# Patient Record
Sex: Male | Born: 1980 | Race: White | Hispanic: Yes | Marital: Married | Smoking: Never smoker
Health system: Southern US, Community
[De-identification: ages and names within clinical notes are randomized; demographics above are authoritative.]

---

## 2005-11-25 ENCOUNTER — Emergency Department (HOSPITAL_COMMUNITY): Admission: EM | Admit: 2005-11-25 | Discharge: 2005-11-25 | Payer: Self-pay | Admitting: Emergency Medicine

## 2006-11-29 ENCOUNTER — Ambulatory Visit: Payer: Self-pay | Admitting: Family Medicine

## 2006-11-30 ENCOUNTER — Ambulatory Visit: Payer: Self-pay | Admitting: *Deleted

## 2006-12-17 ENCOUNTER — Ambulatory Visit: Payer: Self-pay | Admitting: Family Medicine

## 2007-08-14 ENCOUNTER — Encounter (INDEPENDENT_AMBULATORY_CARE_PROVIDER_SITE_OTHER): Payer: Self-pay | Admitting: *Deleted

## 2015-02-06 ENCOUNTER — Encounter: Payer: Self-pay | Admitting: *Deleted

## 2015-02-06 ENCOUNTER — Ambulatory Visit (INDEPENDENT_AMBULATORY_CARE_PROVIDER_SITE_OTHER): Payer: Self-pay | Admitting: Family Medicine

## 2015-02-06 ENCOUNTER — Ambulatory Visit (INDEPENDENT_AMBULATORY_CARE_PROVIDER_SITE_OTHER): Payer: Self-pay

## 2015-02-06 VITALS — BP 130/74 | HR 76 | Temp 98.7°F | Resp 16 | Ht 66.5 in | Wt 168.1 lb

## 2015-02-06 DIAGNOSIS — M545 Low back pain: Secondary | ICD-10-CM

## 2015-02-06 MED ORDER — HYDROCODONE-ACETAMINOPHEN 5-325 MG PO TABS: 1.0000 | ORAL_TABLET | Freq: Four times a day (QID) | ORAL | Status: AC | PRN

## 2015-02-06 MED ORDER — PREDNISONE 20 MG PO TABS: 40.0000 mg | ORAL_TABLET | Freq: Every day | ORAL | Status: AC

## 2015-02-06 NOTE — Progress Notes (Addendum)
° °  Subjective:    Patient ID: Allen Dickson, male    DOB: 01/11/1981, 34 y.o.   MRN: [id number] This chart was scribed for Elvina SidleKurt Lauenstein, MD by Littie Deedsichard Sun, Medical Scribe. This patient was seen in Room 14 and the patient's care was started at 3:48 PM.   HPI HPI Comments: Allen Dickson is a 34 y.o. male who presents to the [facility] complaining of sudden onset left-sided low back pain that started 3 days ago while he was walking. He has taken aspirin for his pain. Patient denies abdominal pain and urinary symptoms.  Patient works in [employer].  Review of Systems  Gastrointestinal: Negative for abdominal pain.  Genitourinary: Negative.   Musculoskeletal: Positive for back pain.       Objective:   Physical Exam CONSTITUTIONAL: Well developed/well nourished HEAD: Normocephalic/atraumatic EYES: EOM/PERRL ENMT: Mucous membranes moist NECK: supple no meningeal signs SPINE: Mildly tender on left side of lumbar spine with no scoliosis. Straight leg raise positive bilaterally. He cannot lie down without pain.  No scoliosis CV: S1/S2 noted, no murmurs/rubs/gallops noted LUNGS: Lungs are clear to auscultation bilaterally, no apparent distress ABDOMEN: soft, nontender, no rebound or guarding GU: no cva tenderness NEURO: Pt is awake/alert, moves all extremitiesx4. Good strength in both legs. Weak reflexes in knees and ankles which are symmetrical.  EXTREMITIES: pulses normal, full ROM SKIN: warm, color normal PSYCH: no abnormalities of mood noted  [facility] reading (PRIMARY) by  Dr. Milus GlazierLauenstein:  LS spine films:  No scoliosis, no bony abnormality.  Possible very slight narrowing of disc space L5-S1      Assessment & Plan:   This chart was scribed in my presence and reviewed by me personally.    ICD-9-CM ICD-10-CM   1. Low back pain, unspecified back pain laterality, with sciatica presence unspecified 724.2 M54.5 DG Lumbar Spine 2-3 Views       predniSONE (DELTASONE) 20 MG tablet     HYDROcodone-acetaminophen (NORCO) 5-325 MG per tablet    Signed, Elvina SidleKurt Lauenstein, MD

## 2015-02-06 NOTE — Patient Instructions (Signed)
Citica  (Sciatica)  La citica es el dolor, debilidad, entumecimiento u hormigueo a lo largo del nervio citico. El nervio comienza en la zona inferior de la espalda y desciende por la parte posterior de cada pierna. El nervio controla los msculos de la parte inferior de la pierna y de la zona posterior de la rodilla, y transmite la sensibilidad a la parte posterior del muslo, la pierna y la planta del pie. La citica es un sntoma de otras afecciones mdicas. Por ejemplo, un dao a los nervios o algunas enfermedades como un disco herniado o un espoln seo en la columna vertebral, podran daarle o presionar en el nervio citico. Esto causa dolor, debilidad y otras sensaciones normalmente asociadas con la citica. Generalmente la citica afecta slo un lado del cuerpo. CAUSAS   Disco herniado o desplazado.  Enfermedad degenerativa del disco.  Un sndrome doloroso que compromete un msculo angosto de los glteos (sndrome piriforme).  Lesin o fractura plvica.  Embarazo.  Tumor (casos raros). SNTOMAS  Los sntomas pueden variar de leves a muy graves. Por lo general, los sntomas descienden desde la zona lumbar a las nalgas y la parte posterior de la pierna. Ellos son:   Hormigueo leve o dolor sordo en la parte inferior de la espalda, la pierna o la cadera.  Adormecimiento en la parte posterior de la pantorrilla o la planta del pie.  Sensacin de quemazn en la zona lumbar, la pierna o la cadera.  Dolor agudo en la zona inferior de la espalda, la pierna o la cadera.  Debilidad en las piernas.  Dolor de espalda intenso que inhibe los movimientos. Los sntomas pueden empeorar al toser, estornudar, rer o estar sentado o parado durante mucho tiempo. Adems, el sobrepeso puede empeorar los sntomas.  DIAGNSTICO  Su mdico le har un examen fsico para buscar los sntomas comunes de la citica. Le pedir que haga algunos movimientos o actividades que activaran el dolor del nervio  citico. Para encontrar las causas de la citica podr indicarle otros estudios. Estos pueden ser:   Anlisis de sangre.  Radiografas.  Pruebas de diagnstico por imgenes, como resonancia magntica o tomografa computada. TRATAMIENTO  El tratamiento se dirige a las causas de la citica. A veces, el tratamiento no es necesario, y el dolor y el malestar desaparecen por s mismos. Si necesita tratamiento, su mdico puede sugerir:   Medicamentos de venta libre para aliviar el dolor.  Medicamentos recetados, como antiinflamatorios, relajantes musculares o narcticos.  Aplicacin de calor o hielo en la zona del dolor.  Inyecciones de corticoides para disminuir el dolor, la irritacin y la inflamacin alrededor del nervio.  Reduccin de la actividad en los perodos de dolor.  Ejercicios y estiramiento del abdomen para fortalecer y mejorar la flexibilidad de la columna vertebral. Su mdico puede sugerirle perder peso si el peso extra empeora el dolor de espalda.  Fisioterapia.  La ciruga para eliminar lo que presiona o pincha el nervio, como un espoln seo o parte de una hernia de disco. INSTRUCCIONES PARA EL CUIDADO EN EL HOGAR   Slo tome medicamentos de venta libre o recetados para calmar el dolor o el malestar, segn las indicaciones de su mdico.  Aplique hielo sobre el rea dolorida durante 20 minutos 3-4 veces por da durante los primeras 48-72 horas. Luego intente aplicar calor de la misma manera.  Haga ejercicios, elongue o realice sus actividades habituales, si no le causan ms dolor.  Cumpla con todas las sesiones de fisioterapia, segn le   indique su mdico.  Cumpla con todas las visitas de control, segn le indique su mdico.  No use tacones altos o zapatos que no tengan buen apoyo.  Verifique que el colchn no sea muy blando. Un colchn firme aliviar el dolor y las molestias. SOLICITE ATENCIN MDICA DE INMEDIATO SI:   Pierde el control de la vejiga o del intestino  (incontinencia).  Aumenta la debilidad en la zona inferior de la espalda, la pelvis, las nalgas o las piernas.  Siente irritacin o inflamacin en la espalda.  Tiene sensacin de ardor al orinar.  El dolor empeora cuando se acuesta o lo despierta por la noche.  El dolor es peor del que experiment en el pasado.  Dura ms de 4 semanas.  Pierde peso sin motivo de manera sbita. ASEGRESE DE QUE:   Comprende estas instrucciones.  Controlar su enfermedad.  Solicitar ayuda de inmediato si no mejora o si empeora. Document Released: 11/13/2005 Document Revised: 05/14/2012 [employer] Patient Information 2015 [employer]. This information is not intended to replace advice given to you by your health care provider. Make sure you discuss any questions you have with your health care provider.  

## 2016-07-29 IMAGING — CR DG LUMBAR SPINE 2-3V
3 series · 3 of 3 positions shown · non-contrast
Comparison: None.

CLINICAL DATA: Low back pain

EXAM:
LUMBAR SPINE - 2-3 VIEW

[AP]
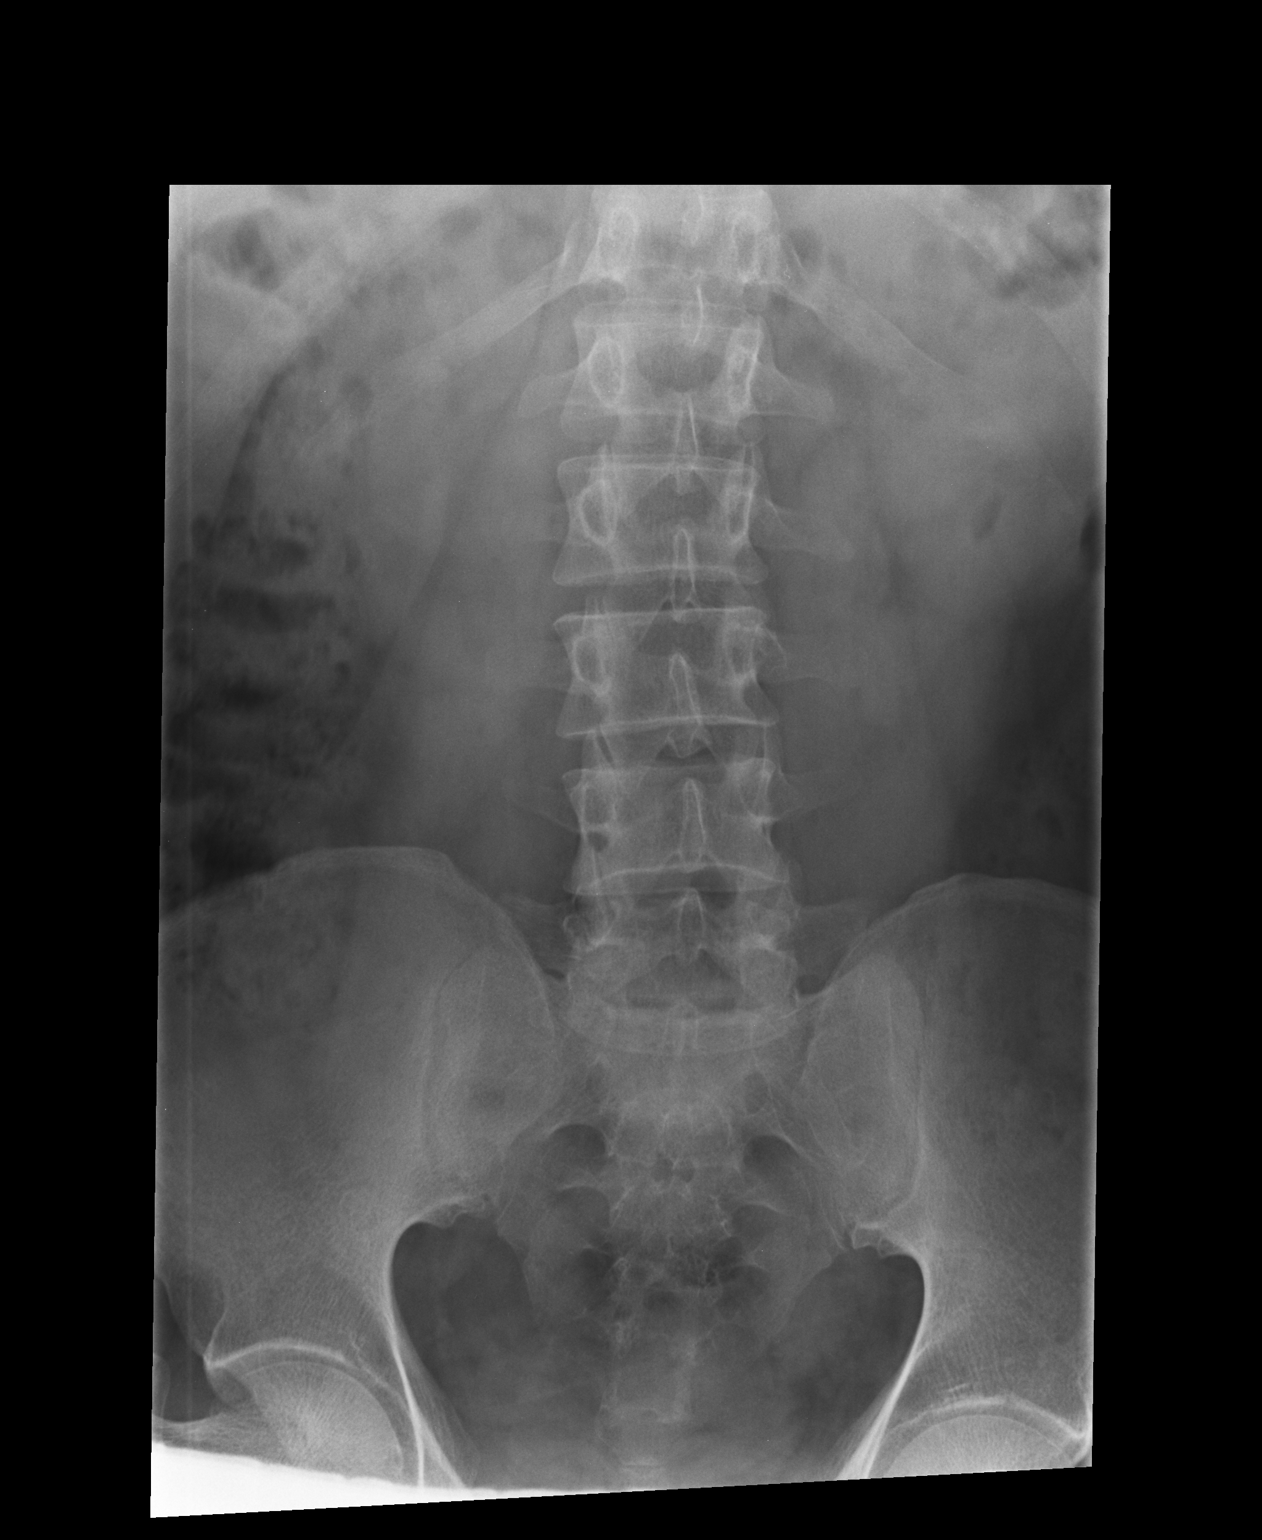

[lateral]
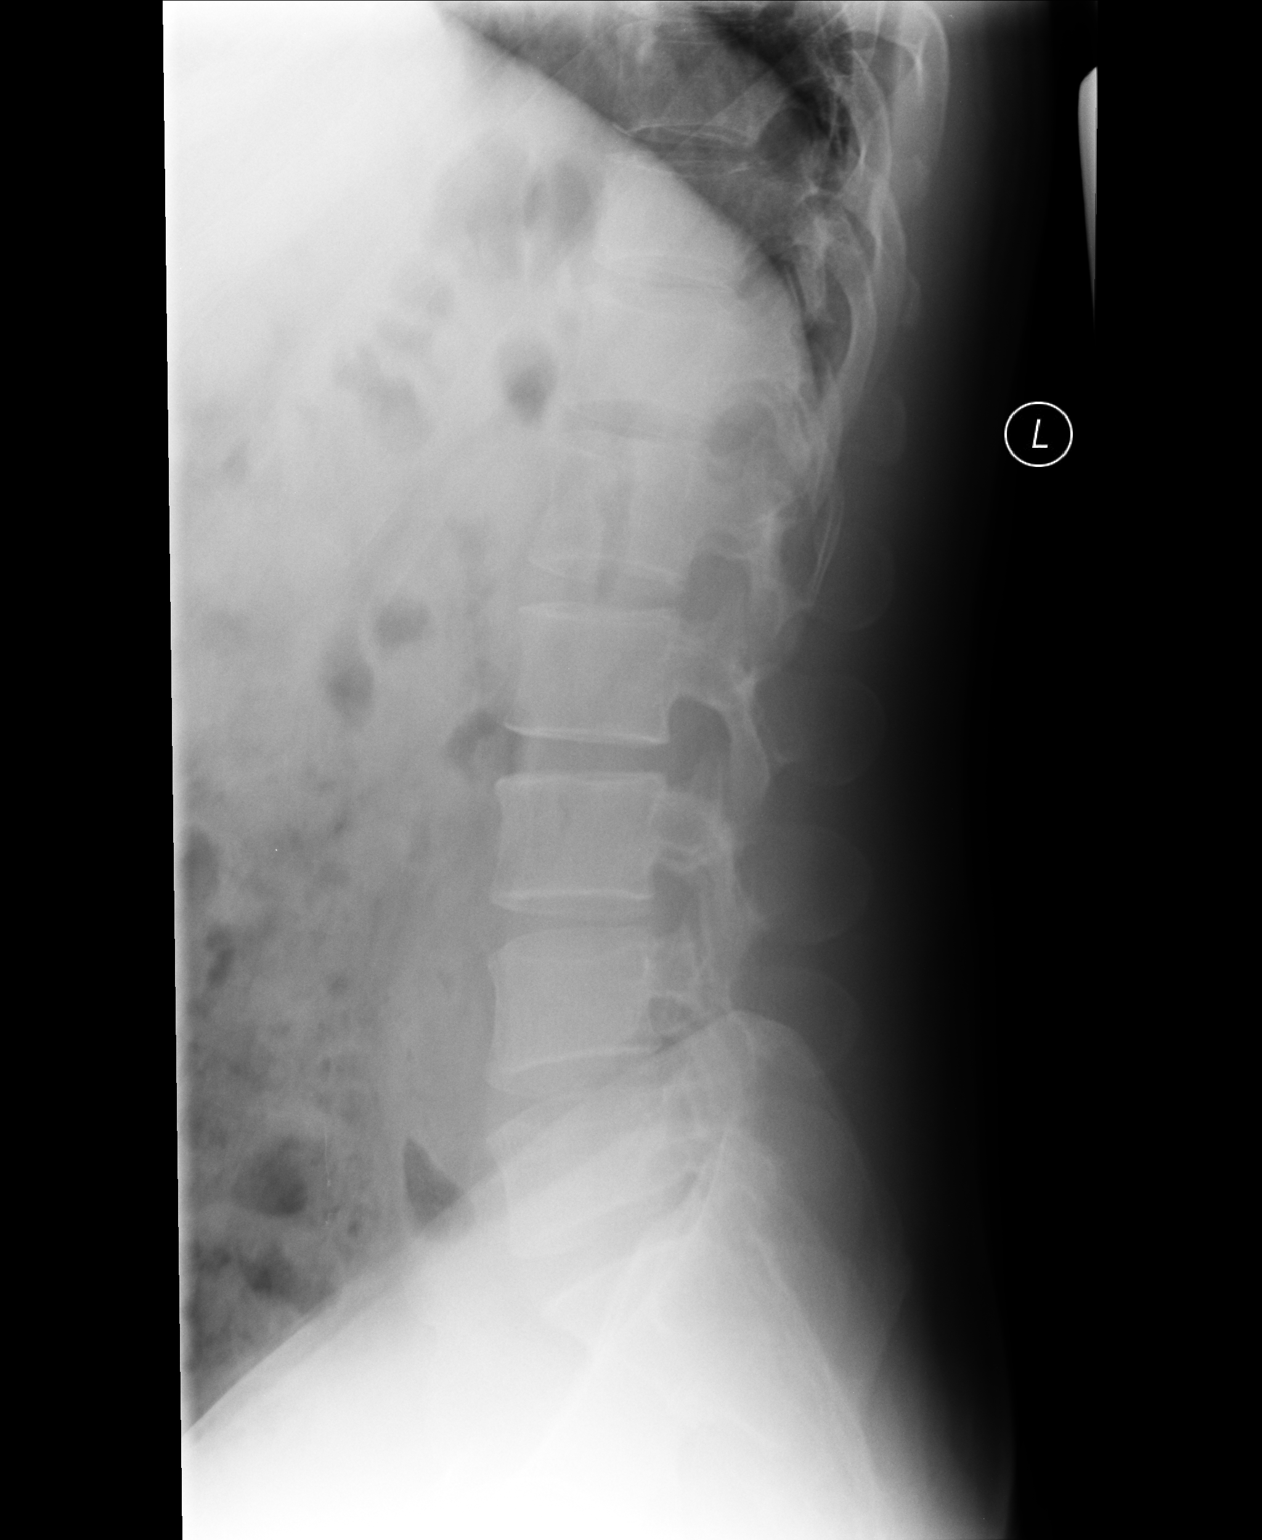

[l5 s1]
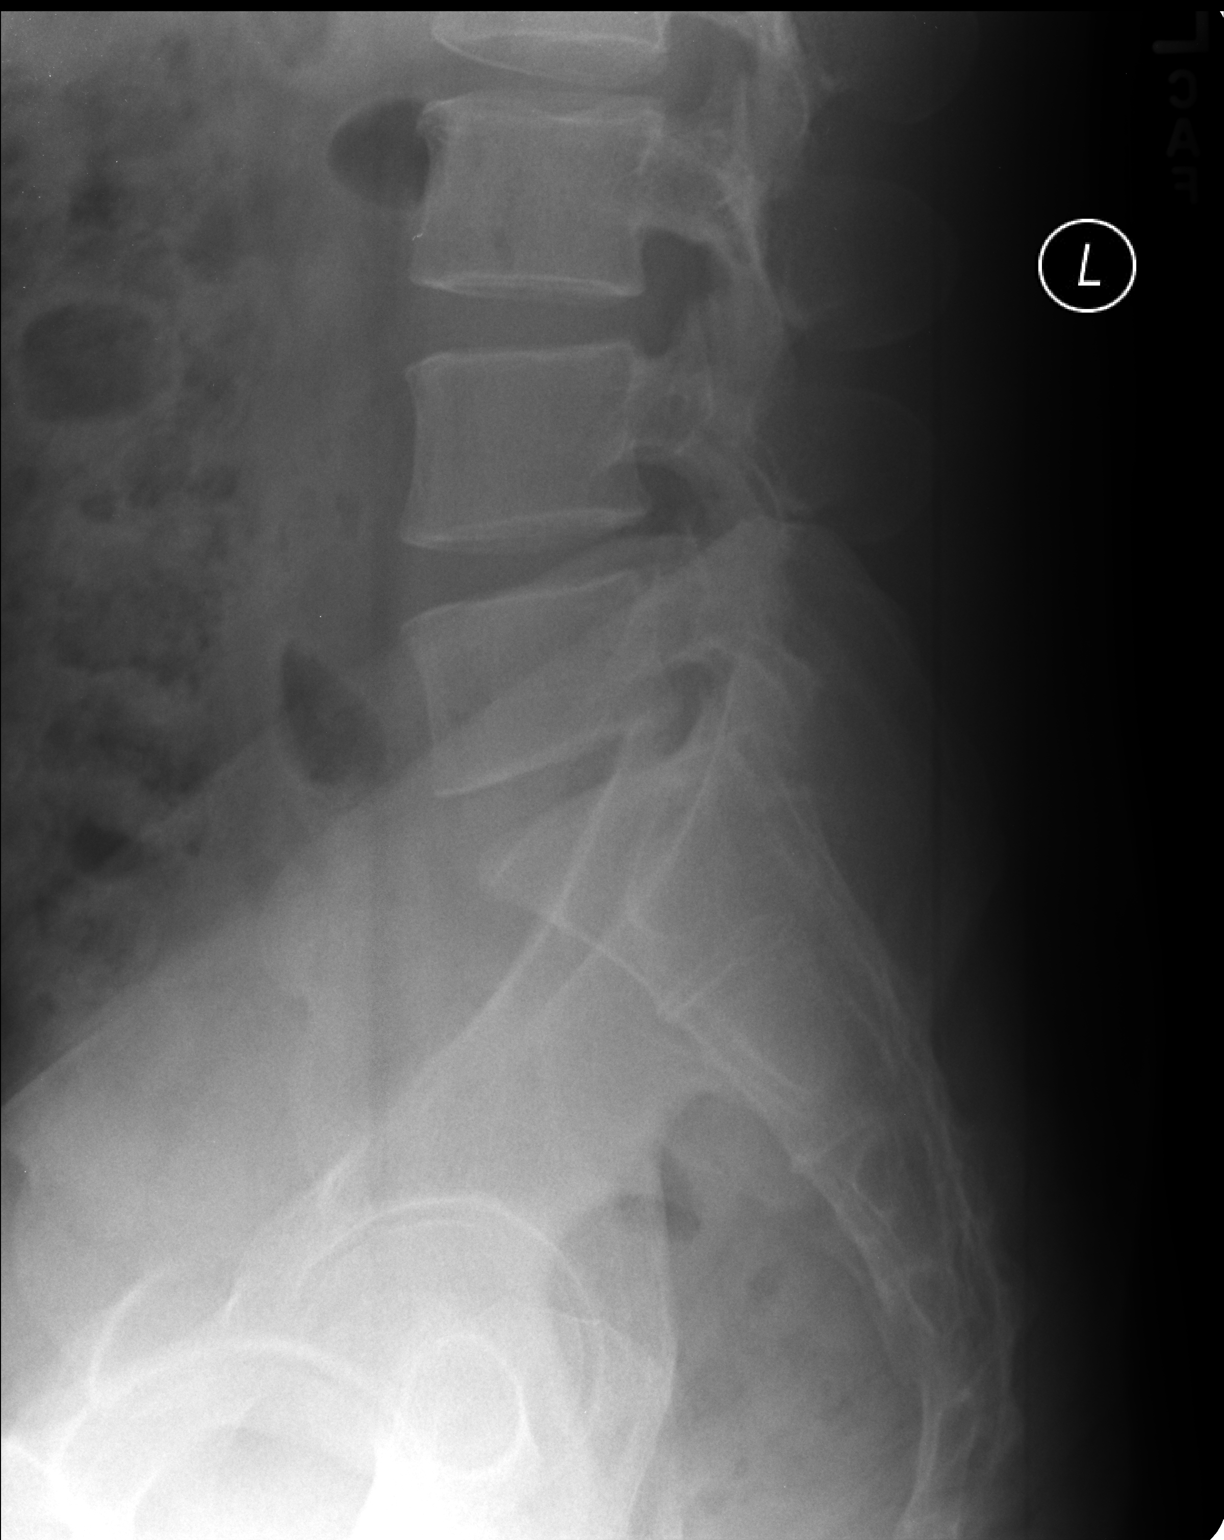

[3 of 3 positions shown; findings below may reference images not displayed]

FINDINGS: There is no evidence of lumbar spine fracture. Alignment is normal.
Intervertebral disc spaces are maintained.
IMPRESSION: Negative.
